# Patient Record
Sex: Male | Born: 2000 | Race: Black or African American | Hispanic: No | Marital: Single
Health system: Southern US, Community
[De-identification: ages and names within clinical notes are randomized; demographics above are authoritative.]

---
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
## 2018-02-21 ENCOUNTER — Emergency Department (HOSPITAL_COMMUNITY)
Admission: EM | Admit: 2018-02-21 | Discharge: 2018-02-21 | Disposition: A | Discharge status: Home / Self Care | Payer: BLUE CROSS/BLUE SHIELD | Attending: Emergency Medicine | Admitting: Emergency Medicine

## 2018-02-21 ENCOUNTER — Encounter (HOSPITAL_COMMUNITY): Payer: Self-pay

## 2018-02-21 ENCOUNTER — Other Ambulatory Visit: Payer: Self-pay

## 2018-02-21 DIAGNOSIS — R748 Abnormal levels of other serum enzymes: Secondary | ICD-10-CM | POA: Diagnosis not present

## 2018-02-21 DIAGNOSIS — R42 Dizziness and giddiness: Secondary | ICD-10-CM | POA: Diagnosis not present

## 2018-02-21 DIAGNOSIS — R252 Cramp and spasm: Secondary | ICD-10-CM

## 2018-02-21 LAB — BASIC METABOLIC PANEL
ANION GAP: 12 (ref 5–15)
BUN: 18 mg/dL (ref 6–20)
CO2: 27 mmol/L (ref 22–32)
Calcium: 10.4 mg/dL — ABNORMAL HIGH (ref 8.9–10.3)
Chloride: 102 mmol/L (ref 101–111)
Creatinine, Ser: 1.66 mg/dL — ABNORMAL HIGH (ref 0.61–1.24)
GFR calc non Af Amer: 58 mL/min — ABNORMAL LOW (ref >60)
GLUCOSE: 103 mg/dL — AB (ref 65–99)
Potassium: 4.7 mmol/L (ref 3.5–5.1)
Sodium: 141 mmol/L (ref 135–145)

## 2018-02-21 LAB — CBC WITH DIFFERENTIAL/PLATELET
Basophils Absolute: 0 10*3/uL (ref 0.0–0.1)
Basophils Relative: 0 %
Eosinophils Absolute: 0 10*3/uL (ref 0.0–0.7)
Eosinophils Relative: 0 %
HEMATOCRIT: 45.9 % (ref 39.0–52.0)
Hemoglobin: 15.6 g/dL (ref 13.0–17.0)
LYMPHS PCT: 14 %
Lymphs Abs: 1.2 10*3/uL (ref 0.7–4.0)
MCH: 30.3 pg (ref 26.0–34.0)
MCHC: 34 g/dL (ref 30.0–36.0)
MCV: 89.1 fL (ref 78.0–100.0)
MONO ABS: 0.6 10*3/uL (ref 0.1–1.0)
MONOS PCT: 7 %
NEUTROS ABS: 6.9 10*3/uL (ref 1.7–7.7)
NEUTROS PCT: 79 %
Platelets: 359 10*3/uL (ref 150–400)
RBC: 5.15 MIL/uL (ref 4.22–5.81)
RDW: 12.3 % (ref 11.5–15.5)
WBC: 8.7 10*3/uL (ref 4.0–10.5)

## 2018-02-21 LAB — CK: Total CK: 1908 U/L — ABNORMAL HIGH (ref 49–397)

## 2018-02-21 LAB — MAGNESIUM: Magnesium: 2.1 mg/dL (ref 1.7–2.4)

## 2018-02-21 MED ORDER — SODIUM CHLORIDE 0.9 % IV BOLUS
2000.0000 mL | Freq: Once | INTRAVENOUS | Status: AC
  Administered 2018-02-21: 2000 mL via INTRAVENOUS

## 2018-02-21 NOTE — ED Notes (Signed)
Bed: [facility] Expected date:  Expected time:  Means of arrival:  Comments: 17 yo leg cramps, dehydration

## 2018-02-21 NOTE — ED Triage Notes (Signed)
Pt BIB [facility] from restaurant. Pt was playing basketball today and reports that he didn't drink much water. He reports that both legs are cramping. States that he has had cramps before, but never this badly.

## 2018-02-21 NOTE — Discharge Instructions (Signed)
You were seen in the ED today with muscle cramping. You CK lab was elevated which is a sign of muscle damage. We do not want this lab to increase because if can put strain on the kidneys. You will need to have your CK and Basic Metabolic Panel re-drawn in 36 hours. Your PCP can do this but if they cannot you should return to the ED for repeat labs.   Drink lots of water and rest. No exercise. If you urine becomes dark yellow, brown, or red you need to return to the ED immediately.

## 2018-02-21 NOTE — ED Provider Notes (Signed)
Emergency Department Provider Note   I have reviewed the triage vital signs and the nursing notes.   HISTORY  Chief Complaint Leg Cramps   HPI Albert Yu is a 17 y.o. male presents to the emergency department by EMS for evaluation of leg cramping and generalized weakness after playing 3 indoor basketball games today.  Patient states he did not drink very much water during the games.  He was able to eat some food between games but admits to doing a poor job with hydration.  He is having cramping pain in both legs.  Mom states his right leg developed a "knot" near the knee which she was able to massage out.  He states that he "just wasn't feeling right" so paramedics were called.  He denies any palpitations or chest pain.  No symptoms during play but began to feel lightheaded afterwards.    History reviewed. No pertinent past medical history.  There are no active problems to display for this patient.    Allergies Patient has no known allergies.  History reviewed. No pertinent family history.  Social History Social History   Tobacco Use  . Smoking status: Not on file  Substance Use Topics  . Alcohol use: Not on file  . Drug use: Not on file    Review of Systems  Constitutional: No fever/chills. Positive lightheadedness.  Eyes: No visual changes. ENT: No sore throat. Cardiovascular: Denies chest pain. Respiratory: Denies shortness of breath. Gastrointestinal: No abdominal pain.  No nausea, no vomiting.  No diarrhea.  No constipation. Genitourinary: Negative for dysuria. Musculoskeletal: Negative for back pain. Positive bilateral thigh cramping.  Skin: Negative for rash. Neurological: Negative for headaches, focal weakness or numbness.  10-point ROS otherwise negative.  ____________________________________________   PHYSICAL EXAM:  VITAL SIGNS: ED Triage Vitals  Enc Vitals Group     BP 02/21/18 1926 (!) 144/82     Pulse Rate 02/21/18 1926 88     Resp  02/21/18 1926 14     Temp 02/21/18 1926 97.9 F (36.6 C)     Temp Source 02/21/18 1926 Oral     SpO2 02/21/18 1926 99 %     Pain Score 02/21/18 1927 10   Constitutional: Slightly drowsy but able to provide a full history. Well appearing and in no acute distress. Eyes: Conjunctivae are normal.  Head: Atraumatic. Nose: No congestion/rhinnorhea. Mouth/Throat: Mucous membranes are slightly dry.  Neck: No stridor.  Cardiovascular: Normal rate, regular rhythm. Good peripheral circulation. Grossly normal heart sounds.   Respiratory: Normal respiratory effort.  No retractions. Lungs CTAB. Gastrointestinal: Soft and nontender. No distention.  Musculoskeletal: No lower extremity tenderness nor edema. No gross deformities of extremities. Neurologic:  Normal speech and language. No gross focal neurologic deficits are appreciated.  Skin:  Skin is warm, dry and intact. No rash noted.  ____________________________________________   LABS (all labs ordered are listed, but only abnormal results are displayed)  Labs Reviewed  BASIC METABOLIC PANEL - Abnormal; Notable for the following components:      Result Value   Glucose, Bld 103 (*)    Creatinine, Ser 1.66 (*)    Calcium 10.4 (*)    GFR calc non Af Amer 58 (*)    All other components within normal limits  CK - Abnormal; Notable for the following components:   Total CK 1,908 (*)    All other components within normal limits  CBC WITH DIFFERENTIAL/PLATELET  MAGNESIUM   ____________________________________________  EKG   EKG Interpretation  Date/Time:  Sunday Feb 21 2018 20:20:16 EDT Ventricular Rate:  82 PR Interval:  222 QRS Duration: 86 QT Interval:  362 QTC Calculation: 422 R Axis:   85 Text Interpretation:  Sinus rhythm with 1st degree A-V block Early repolarization Otherwise normal ECG No STEMI. No old tracing for comparison.  Confirmed by Alona Bene [phone]) on 02/21/2018 8:29:21 PM        ____________________________________________  RADIOLOGY  None ____________________________________________   PROCEDURES  Procedure(s) performed:   Procedures  None ____________________________________________   INITIAL IMPRESSION / ASSESSMENT AND PLAN / ED COURSE  Pertinent labs & imaging results that were available during my care of the patient were reviewed by me and considered in my medical decision making (see chart for details).  Patient presents to the emergency department for evaluation of leg cramping with lightheadedness.  He is somewhat drowsy during my initial evaluation but denies any head trauma during the games.  He appears dehydrated.  Plan for 2 L IV fluid along with labs including chemistry and CK.   CK elevated although not to the level where I would be concerned for rhabdo. Creatinine elevated but BUN normal. Normal electrolytes. Patient is large and very muscular which may be contributing to increased creatinine. Doubt very seriously that CK is causing kidney injury at this level. He reports feeling much better after 2L IVF. He is urinating light yellow urine. I had a Ashni Lonzo discussion regarding results with the patient and mom at bedside. Ultimately, they have close PCP follow up at home and would prefer to get repeat labs at PCP in 36 hours rather than admit. I discussed that if they cannot be seen and labs repeated in 36 hours they should return to the ED for repeat CK and BMP. Discussed that if urine becomes dark or cramping worsens he should return sooner. Will PO hydrate at home with water/electrolyte sports drink.   At this time, I do not feel there is any life-threatening condition present. I have reviewed and discussed all results (EKG, imaging, lab, urine as appropriate), exam findings with patient. I have reviewed nursing notes and appropriate previous records.  I feel the patient is safe to be discharged home without further emergent workup. Discussed  usual and customary return precautions. Patient and family (if present) verbalize understanding and are comfortable with this plan.  Patient will follow-up with their primary care provider. If they do not have a primary care provider, information for follow-up has been provided to them. All questions have been answered.  ____________________________________________  FINAL CLINICAL IMPRESSION(S) / ED DIAGNOSES  Final diagnoses:  Muscle cramping  Elevated CK     MEDICATIONS GIVEN DURING THIS VISIT:  Medications  sodium chloride 0.9 % bolus 2,000 mL (0 mLs Intravenous Stopped 02/21/18 2237)    Note:  This document was prepared using Dragon voice recognition software and may include unintentional dictation errors.  Alona Bene, MD Emergency Medicine    Johna Kearl, Arlyss Repress, MD 02/22/18 [phone]

## 2018-05-26 NOTE — Progress Notes (Signed)
[facility] [address] [place], [place] [address] Phone: [phone] Subjective:      CC: Leg cramping  [id number]  Albert Yu is a 17 y.o. male coming in with complaint of leg cramping. Has history of rhabdomyolysis from May 2019 seen in the emergency room.Marland Kitchen Has not been having any cramping since injury. Does stay hydrated during activity since injury. Is going to be going to prep school and wants to make sure that he is healthy to do so.  Has been watching his diet and staying more hydrated.  Patient though has not worked out significantly arthritis noted since then.  Patient wants to be able to play collegiate basketball as well.     History reviewed. No pertinent past medical history. History reviewed. No pertinent surgical history. Social History   Socioeconomic History  . Marital status: Single    Spouse name: Not on file  . Number of children: Not on file  . Years of education: Not on file  . Highest education level: Not on file  Occupational History  . Not on file  Social Needs  . Financial resource strain: Not on file  . Food insecurity:    Worry: Not on file    Inability: Not on file  . Transportation needs:    Medical: Not on file    Non-medical: Not on file  Tobacco Use  . Smoking status: Not on file  Substance and Sexual Activity  . Alcohol use: Not on file  . Drug use: Not on file  . Sexual activity: Not on file  Lifestyle  . Physical activity:    Days per week: Not on file    Minutes per session: Not on file  . Stress: Not on file  Relationships  . Social connections:    Talks on phone: Not on file    Gets together: Not on file    Attends religious service: Not on file    Active member of club or organization: Not on file    Attends meetings of clubs or organizations: Not on file    Relationship status: Not on file  Other Topics Concern  . Not on file  Social History Narrative  . Not on file   No Known  Allergies History reviewed. No pertinent family history.  No family history of early kidney disease or cardiac history     Current Outpatient Medications (Analgesics):  Marland Kitchen  Ibuprofen-Famotidine (DUEXIS PO), Take 1 tablet by mouth daily.      Past medical history, social, surgical and family history all reviewed in electronic medical record.  No pertanent information unless stated regarding to the chief complaint.   Review of Systems:  No headache, visual changes, nausea, vomiting, diarrhea, constipation, dizziness, abdominal pain, skin rash, fevers, chills, night sweats, weight loss, swollen lymph nodes, body aches, joint swelling, muscle aches, chest pain, shortness of breath, mood changes.   Objective  Blood pressure 128/84, pulse 69, weight 234 lb (106.1 kg), SpO2 98 %.    General: No apparent distress alert and oriented x3 mood and affect normal, dressed appropriately.  HEENT: Pupils equal, extraocular movements intact  Respiratory: Patient's speak in full sentences and does not appear short of breath  Cardiovascular: No lower extremity edema, non tender, no erythema regular rate and rhythm no murmur appreciated Skin: Warm dry intact with no signs of infection or rash on extremities or on axial skeleton.  Abdomen: Soft nontender  Neuro: Cranial nerves II through XII  are intact, neurovascularly intact in all extremities with 2+ DTRs and 2+ pulses.  Lymph: No lymphadenopathy of posterior or anterior cervical chain or axillae bilaterally.  Gait normal with good balance and coordination.  MSK:  Non tender with full range of motion and good stability and symmetric strength and tone of shoulders, elbows, wrist, hip, knee and ankles bilaterally.  Neck: Inspection unremarkable. No palpable stepoffs. Negative Spurling's maneuver. Full neck range of motion Grip strength and sensation normal in bilateral hands Strength good C4 to T1 distribution No sensory change to C4 to T1 Negative  Hoffman sign bilaterally Reflexes normal  Back Exam:  Inspection: Unremarkable  Motion: Flexion 45 deg, Extension 45 deg, Side Bending to 45 deg bilaterally,  Rotation to 45 deg bilaterally  SLR laying: Negative  XSLR laying: Negative  Palpable tenderness: None. FABER: negative. Sensory change: Gross sensation intact to all lumbar and sacral dermatomes.  Reflexes: 2+ at both patellar tendons, 2+ at achilles tendons, Babinski's downgoing.  Strength at foot  Plantar-flexion: 5/5 Dorsi-flexion: 5/5 Eversion: 5/5 Inversion: 5/5  Leg strength  Quad: 5/5 Hamstring: 5/5 Hip flexor: 5/5 Hip abductors: 5/5  Gait unremarkable.    Impression and Recommendations:     This case required medical decision making of moderate complexity. The above documentation has been reviewed and is accurate and complete Judi SaaZachary M Syncere Kaminski, DO       Note: This dictation was prepared with Dragon dictation along with smaller phrase technology. Any transcriptional errors that result from this process are unintentional.

## 2018-05-27 ENCOUNTER — Encounter: Payer: Self-pay | Admitting: Family Medicine

## 2018-05-27 ENCOUNTER — Ambulatory Visit (INDEPENDENT_AMBULATORY_CARE_PROVIDER_SITE_OTHER): Payer: 59 | Admitting: Family Medicine

## 2018-05-27 DIAGNOSIS — E860 Dehydration: Secondary | ICD-10-CM | POA: Diagnosis not present

## 2018-05-27 NOTE — Patient Instructions (Signed)
Good to see you  Stay active Stay hydrated  Every 1 cup of water 1 cup of gatorade A mult vitamin, Vitamin D 2000 U daily and CoQ10 200mg  daily  Carbs after working out for sure! At least 20 grams of protein after working out and need to eat within 30 minutes of workouts.  Eat something little every 2 hours Red meat 2 times a week  Vega sport or plant base supplements otherwise but avoid soy  You will do great  Any questions write us or call [phone]

## 2018-05-27 NOTE — Assessment & Plan Note (Signed)
I believe the patient did have more of a dehydration after exertion. With patient's CK level being elevated but not greater than 50,000 not likely more of a rhabdomyolysis.  Patient creatinine was elevated but patient is fairly muscular.  Discussed with him that I feel like he will be able to participate in any type of support at this time but we did discuss different diet and hydration changes that would be more beneficial.  We discussed that CoQ10 can help stabilize muscle fibers and could also be of benefit if patient feels like he has cramping.  Patient does not give any other history of cramping.  On exam today I see no other reason for the lightheadedness the patient was having other than the dehydration with patient having a regular cardiovascular exam.  Patient can follow-up with me with any questions and will return in 4 to 6 weeks to see how patient does with the changes.

## 2019-04-07 ENCOUNTER — Encounter: Payer: Self-pay | Admitting: Family Medicine

## 2019-04-07 ENCOUNTER — Other Ambulatory Visit: Payer: Self-pay

## 2019-04-07 ENCOUNTER — Ambulatory Visit (INDEPENDENT_AMBULATORY_CARE_PROVIDER_SITE_OTHER): Payer: 59 | Admitting: Family Medicine

## 2019-04-07 DIAGNOSIS — Z025 Encounter for examination for participation in sport: Secondary | ICD-10-CM

## 2019-04-07 DIAGNOSIS — Z0000 Encounter for general adult medical examination without abnormal findings: Secondary | ICD-10-CM

## 2019-04-07 NOTE — Assessment & Plan Note (Signed)
Cleared for sports

## 2019-04-07 NOTE — Progress Notes (Signed)
[facility] [address] [place], [place] [address] Phone: [phone] Subjective:   Albert Albert [name], am serving as a scribe for Dr. Hulan Saas.  I'm seeing this patient by the request  of:    CC:   [id number]   05/27/2018: I believe the patient did have more of a dehydration after exertion. With patient's CK level being elevated but not greater than 50,000 not likely more of a rhabdomyolysis.  Patient creatinine was elevated but patient is fairly muscular.  Discussed with him that I feel like he will be able to participate in any type of support at this time but we did discuss different diet and hydration changes that would be more beneficial.  We discussed that CoQ10 can help stabilize muscle fibers and could also be of benefit if patient feels like he has cramping.  Patient does not give any other history of cramping.  On exam today I see Albert [name] other reason for the lightheadedness the patient was having other than the dehydration with patient having a regular cardiovascular exam.  Patient can follow-up with me with any questions and will return in 4 to 6 weeks to see how patient does with the changes.  Update 04/07/2019 Albert Albert [name] is a 18 y.o. male coming in with complaint of knee pain and is here for a sports physical. Has not been having any issues with dehydration and cramping since last visit.       Albert [name] past medical history on file. Albert [name] past surgical history on file. Social History   Socioeconomic History  . Marital status: Single    Spouse name: Not on file  . Number of children: Not on file  . Years of education: Not on file  . Highest education level: Not on file  Occupational History  . Not on file  Social Needs  . Financial resource strain: Not on file  . Food insecurity    Worry: Not on file    Inability: Not on file  . Transportation needs    Medical: Not on file    Non-medical: Not on file  Tobacco Use  . Smoking status: Not on  file  Substance and Sexual Activity  . Alcohol use: Not on file  . Drug use: Not on file  . Sexual activity: Not on file  Lifestyle  . Physical activity    Days per week: Not on file    Minutes per session: Not on file  . Stress: Not on file  Relationships  . Social Herbalist on phone: Not on file    Gets together: Not on file    Attends religious service: Not on file    Active member of club or organization: Not on file    Attends meetings of clubs or organizations: Not on file    Relationship status: Not on file  Other Topics Concern  . Not on file  Social History Narrative  . Not on file   Albert [name] Known Allergies Albert [name] family history on file.     Current Outpatient Medications (Analgesics):  Marland Kitchen  Ibuprofen-Famotidine (DUEXIS PO), Take 1 tablet by mouth daily.      Past medical history, social, surgical and family history all reviewed in electronic medical record.  Albert [name] pertanent information unless stated regarding to the chief complaint.   Review of Systems:  Albert [name] headache, visual changes, nausea, vomiting, diarrhea, constipation, dizziness, abdominal pain, skin rash, fevers, chills, night sweats, weight loss, swollen lymph  nodes, body aches, joint swelling, muscle aches, chest pain, shortness of breath, mood changes.   Objective  Blood pressure 102/80, pulse 74, height 6\' 2"  (1.88 m), weight 264 lb 9.6 oz (120 kg), SpO2 96 %.    General: Albert [name] apparent distress alert and oriented x3 mood and affect normal, dressed appropriately.  HEENT: Pupils equal, extraocular movements intact  Respiratory: Patient's speak in full sentences and does not appear short of breath  Cardiovascular: Albert [name] lower extremity edema, non tender, Albert [name] erythema RRR Albert [name] murmur   Skin: Warm dry intact with Albert [name] signs of infection or rash on extremities or on axial skeleton.  Abdomen: Soft nontender  Neuro: Cranial nerves II through XII are intact, neurovascularly intact in all extremities with 2+ DTRs and  2+ pulses.  Lymph: Albert [name] lymphadenopathy of posterior or anterior cervical chain or axillae bilaterally.  Gait normal with good balance and coordination.  MSK:  Non tender with full range of motion and good stability and symmetric strength and tone of shoulders, elbows, wrist, hip, knee and ankles bilaterally.     Impression and Recommendations:     This case required medical decision making of moderate complexity. The above documentation has been reviewed and is accurate and complete Albert SaaZachary M Janell Keeling, DO       Note: This dictation was prepared with Dragon dictation along with smaller phrase technology. Any transcriptional errors that result from this process are unintentional.

## 2019-11-28 ENCOUNTER — Telehealth: Payer: Self-pay | Admitting: Family Medicine

## 2019-11-28 NOTE — Telephone Encounter (Signed)
We can see them first if they would like and see what we can do

## 2019-11-28 NOTE — Telephone Encounter (Signed)
Spoke with Alecia Lemming, patient's dad. Patient had ACL reconstruction by Dr. Thurston Hole in 2018. Patient is now having antalgic gait and his dad thinks patient needs to have scar tissue removed. Wanted your opinion on if they should come to see you or schedule a visit directly with Dr. Thurston Hole. Please advise.

## 2019-11-28 NOTE — Telephone Encounter (Signed)
Patient's dad called stating that he was seen by Dr Katrinka Blazing last year for an ACL injury. He had surgery but they are now wanting to get the scar tissue cleaned up and did not know where to go from here. Would he need to see Dr Katrinka Blazing first or what would you recommend?  Dad Alecia Lemming): [phone]

## 2019-11-29 NOTE — Telephone Encounter (Signed)
Patient scheduled.

## 2019-12-01 ENCOUNTER — Ambulatory Visit: Payer: 59 | Admitting: Family Medicine

## 2019-12-01 ENCOUNTER — Telehealth: Payer: Self-pay | Admitting: Family Medicine

## 2019-12-01 NOTE — Telephone Encounter (Signed)
Patient in quarantine due to exposure to COVID. Rescheduled to the end of March.

## 2019-12-01 NOTE — Telephone Encounter (Signed)
Left message for patient's father to call back.

## 2019-12-01 NOTE — Telephone Encounter (Signed)
Patient's father called asking for a call back when available.   Father Alecia Lemming): [phone]

## 2019-12-02 ENCOUNTER — Ambulatory Visit: Payer: 59 | Admitting: Family Medicine

## 2019-12-27 ENCOUNTER — Ambulatory Visit (INDEPENDENT_AMBULATORY_CARE_PROVIDER_SITE_OTHER): Payer: 59

## 2019-12-27 ENCOUNTER — Ambulatory Visit (INDEPENDENT_AMBULATORY_CARE_PROVIDER_SITE_OTHER): Payer: 59 | Admitting: Family Medicine

## 2019-12-27 ENCOUNTER — Other Ambulatory Visit: Payer: Self-pay

## 2019-12-27 ENCOUNTER — Encounter: Payer: Self-pay | Admitting: Family Medicine

## 2019-12-27 VITALS — BP 128/88 | HR 72 | Ht 74.0 in | Wt 275.0 lb

## 2019-12-27 DIAGNOSIS — M25562 Pain in left knee: Secondary | ICD-10-CM

## 2019-12-27 DIAGNOSIS — G8929 Other chronic pain: Secondary | ICD-10-CM

## 2019-12-27 DIAGNOSIS — M25662 Stiffness of left knee, not elsewhere classified: Secondary | ICD-10-CM | POA: Insufficient documentation

## 2019-12-27 NOTE — Progress Notes (Signed)
[place] Phone: [phone] Subjective:   Fontaine No, am serving as a scribe for Dr. Hulan Saas. This visit occurred during the SARS-CoV-2 public health emergency.  Safety protocols were in place, including screening questions prior to the visit, additional usage of staff PPE, and extensive cleaning of exam room while observing appropriate contact time as indicated for disinfecting solutions.    I'm seeing this patient by the request  of:  Rosalyn Charters, MD  CC: Knee pain  [id number]  Albert Yu is a 19 y.o. male coming in with complaint of left knee pain since 2018 after ACL surgery. [employer]. Is unable to fully extend or flex his knee. Jumping or cutting increases pain over lateral hamstring. Patient notices a tightness on a consistent basis. Father says patient walks with antalgic gait.       No past medical history on file. No past surgical history on file. Social History   Socioeconomic History  . Marital status: Single    Spouse name: Not on file  . Number of children: Not on file  . Years of education: Not on file  . Highest education level: Not on file  Occupational History  . Not on file  Tobacco Use  . Smoking status: Not on file  Substance and Sexual Activity  . Alcohol use: Not on file  . Drug use: Not on file  . Sexual activity: Not on file  Other Topics Concern  . Not on file  Social History Narrative  . Not on file   Social Determinants of Health   Financial Resource Strain:   . Difficulty of Paying Living Expenses:   Food Insecurity:   . Worried About Charity fundraiser in the Last Year:   . Arboriculturist in the Last Year:   Transportation Needs:   . Film/video editor (Medical):   Marland Kitchen Lack of Transportation (Non-Medical):   Physical Activity:   . Days of Exercise per Week:   . Minutes of Exercise per Session:   Stress:   . Feeling of Stress  :   Social Connections:   . Frequency of Communication with Friends and Family:   . Frequency of Social Gatherings with Friends and Family:   . Attends Religious Services:   . Active Member of Clubs or Organizations:   . Attends Archivist Meetings:   Marland Kitchen Marital Status:    No Known Allergies No family history on file.     Current Outpatient Medications (Analgesics):  Marland Kitchen  Ibuprofen-Famotidine (DUEXIS PO), Take 1 tablet by mouth daily.     Reviewed prior external information including notes and imaging from  primary care provider As well as notes that were available from care everywhere and other healthcare systems.  Past medical history, social, surgical and family history all reviewed in electronic medical record.  No pertanent information unless stated regarding to the chief complaint.   Review of Systems:  No headache, visual changes, nausea, vomiting, diarrhea, constipation, dizziness, abdominal pain, skin rash, fevers, chills, night sweats, weight loss, swollen lymph nodes, body aches, joint swelling, chest pain, shortness of breath, mood changes. POSITIVE muscle aches  Objective  Blood pressure 128/88, pulse 72, height 6\' 2"  (1.88 m), weight 275 lb (124.7 kg), SpO2 98 %.   General: No apparent distress alert and oriented x3 mood and affect normal, dressed appropriately.  HEENT: Pupils equal, extraocular movements intact  Respiratory: Patient's  speak in full sentences and does not appear short of breath  Cardiovascular: No lower extremity edema, non tender, no erythema  Neuro: Cranial nerves II through XII are intact, neurovascularly intact in all extremities with 2+ DTRs and 2+ pulses.  Gait antalgic gait Left knee exam patient is unable to actually straighten the knee with walking.  Patient also lacks the last 10 degrees of extension in the last 10 degrees of flexion compared to the contralateral side.  ACL does appear to be intact.  Kneecap seems to do relatively  well at the moment.  Limited musculoskeletal ultrasound was performed and interpreted by Judi Saa  Limited ultrasound of patient's left knee does not show any significant scar tissue on the outside.  Questionable mild scar tissue in the patellofemoral joint.  Patient's meniscus appear to be unremarkable in the medial and lateral joint space.  No joint effusion noted.   Impression and Recommendations:     This case required medical decision making of moderate complexity. The above documentation has been reviewed and is accurate and complete Judi Saa, DO       Note: This dictation was prepared with Dragon dictation along with smaller phrase technology. Any transcriptional errors that result from this process are unintentional.

## 2019-12-27 NOTE — Patient Instructions (Addendum)
Xray today MRI left knee- [facility] [phone] Once you have appointment let us know so we can see you next day

## 2019-12-27 NOTE — Assessment & Plan Note (Signed)
Significant stiffness of the left knee noted today.  Patient has been able to ambulate and play basketball but he has aspirations to play at a higher level.  This is status post ACL repair.  No significant instability noted.  Would like to make sure patient does not have an OCD and likely this is more scar tissue formation with a questionable injections versus formal physical therapy that could be beneficial.  Patient is playing on scholarship and this is a social determinants of health and allows him to go to college.  Patient will have the advanced imaging and then follow-up with me and we will discuss further evaluation and treatment

## 2020-01-26 ENCOUNTER — Ambulatory Visit
Admission: RE | Admit: 2020-01-26 | Discharge: 2020-01-26 | Disposition: A | Discharge status: Home / Self Care | Payer: 59 | Source: Ambulatory Visit | Attending: Family Medicine | Admitting: Family Medicine

## 2020-01-26 ENCOUNTER — Other Ambulatory Visit: Payer: Self-pay

## 2020-01-26 DIAGNOSIS — G8929 Other chronic pain: Secondary | ICD-10-CM

## 2020-01-26 DIAGNOSIS — M25562 Pain in left knee: Secondary | ICD-10-CM

## 2020-01-27 ENCOUNTER — Encounter: Payer: Self-pay | Admitting: Family Medicine

## 2020-01-27 ENCOUNTER — Ambulatory Visit (INDEPENDENT_AMBULATORY_CARE_PROVIDER_SITE_OTHER): Payer: 59 | Admitting: Family Medicine

## 2020-01-27 VITALS — BP 122/92 | HR 66 | Ht 74.0 in | Wt 270.0 lb

## 2020-01-27 DIAGNOSIS — M25662 Stiffness of left knee, not elsewhere classified: Secondary | ICD-10-CM | POA: Diagnosis not present

## 2020-01-27 NOTE — Patient Instructions (Signed)
[facility] will call you to schedule Dr. Yisroel Ramming

## 2020-01-27 NOTE — Progress Notes (Signed)
[place] Phone: [phone] Subjective:   Albert [name], am serving as a scribe for Dr. Hulan Saas. This visit occurred during the SARS-CoV-2 public health emergency.  Safety protocols were in place, including screening questions prior to the visit, additional usage of staff PPE, and extensive cleaning of exam room while observing appropriate contact time as indicated for disinfecting solutions.   I'm seeing this patient by the request  of:  Rosalyn Charters, MD  CC: Knee pain follow-up  [id number]   12/27/2019 Significant stiffness of the left knee noted today.  Patient has been able to ambulate and play basketball but he has aspirations to play at a higher level.  This is status post ACL repair.  [name] significant instability noted.  Would like to make sure patient does not have an OCD and likely this is more scar tissue formation with a questionable injections versus formal physical therapy that could be beneficial.  Patient is playing on scholarship and this is a social determinants of health and allows him to go to college.  Patient will have the advanced imaging and then follow-up with me and we will discuss further evaluation and treatment  Update 01/27/2020 Albert [name] is a 19 y.o. male coming in with complaint of left knee pain. Patient states  Continues to have difficulty and is still unable to straighten the leg.  Patient was sent for an MRI.  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     [name] past medical history on file. [name] past surgical history on file. Social History   Socioeconomic History  . Marital status: Single    Spouse name: Not on file  . Number of children: Not on file  . Years of education: Not on file  . Highest education level: Not on file  Occupational  History  . Not on file  Tobacco Use  . Smoking status: Not on file  Substance and Sexual Activity  . Alcohol use: Not on file  . Drug use: Not on file  . Sexual activity: Not on file  Other Topics Concern  . Not on file  Social History Narrative  . Not on file   Social Determinants of Health   Financial Resource Strain:   . Difficulty of Paying Living Expenses:   Food Insecurity:   . Worried About Charity fundraiser in the Last Year:   . Arboriculturist in the Last Year:   Transportation Needs:   . Film/video editor (Medical):   Marland Kitchen Lack of Transportation (Non-Medical):   Physical Activity:   . Days of Exercise per Week:   . Minutes of Exercise per Session:   Stress:   . Feeling of Stress :   Social Connections:   . Frequency of Communication with Friends and Family:   . Frequency of Social Gatherings with Friends and Family:   . Attends Religious Services:   . Active Member of Clubs or Organizations:   . Attends Archivist Meetings:   Marland Kitchen Marital Status:    [name] Known Allergies [name] family history on file.     Current Outpatient Medications (Analgesics):  Marland Kitchen  Ibuprofen-Famotidine (DUEXIS PO), Take 1 tablet by mouth daily.     Reviewed prior external information including notes and imaging from  primary care provider As well as notes that  were available from care everywhere and other healthcare systems.  Past medical history, social, surgical and family history all reviewed in electronic medical record.  [name] pertanent information unless stated regarding to the chief complaint.   Review of Systems:  [name] headache, visual changes, nausea, vomiting, diarrhea, constipation, dizziness, abdominal pain, skin rash, fevers, chills, night sweats, weight loss, swollen lymph nodes, body aches, joint swelling, chest pain, shortness of breath, mood changes. POSITIVE muscle aches  Objective  Blood pressure (!) 122/92, pulse 66, height 6\' 2"  (1.88 m), weight 270 lb  (122.5 kg), SpO2 97 %.   General: [name] apparent distress alert and oriented x3 mood and affect normal, dressed appropriately.  HEENT: Pupils equal, extraocular movements intact  Respiratory: Patient's speak in full sentences and does not appear short of breath  Cardiovascular: [name] lower extremity edema, non tender, [name] erythema  Neuro: Cranial nerves II through XII are intact, neurovascularly intact in all extremities with 2+ DTRs and 2+ pulses.  Gait normal with good balance and coordination.  MSK: Left knee exam continues to and lacks the last 10 degrees of extension of the knee.  Patient with standing Has lack of 15 degrees with standing.  Patient does have near full flexion of the knee.  Patient does have good stability of the ACL noted.    Impression and Recommendations:     This case required medical decision making of moderate complexity. The above documentation has been reviewed and is accurate and complete , DO       Note: This dictation was prepared with Dragon dictation along with smaller phrase technology. Any transcriptional errors that result from this process are unintentional.

## 2020-01-27 NOTE — Assessment & Plan Note (Signed)
Discussed with patient as well as his father and mother virtually and showed him pictures of the MRI.  Total time with patient and as well as with father was greater than 40 minutes.  We discussed different treatment options but with patient playing at a high level of basketball I do feel that surgical intervention would be the most beneficial for him.  Patient will be referred today for further evaluation.  I have already discussed images with fellowship trained radiologist as well as the case with orthopedic surgeon.  Patient knows if any questions to call us.

## 2020-05-09 ENCOUNTER — Encounter: Payer: Self-pay | Admitting: Family Medicine

## 2020-05-09 ENCOUNTER — Other Ambulatory Visit: Payer: Self-pay

## 2020-05-09 ENCOUNTER — Ambulatory Visit (INDEPENDENT_AMBULATORY_CARE_PROVIDER_SITE_OTHER): Payer: 59 | Admitting: Family Medicine

## 2020-05-09 DIAGNOSIS — M25662 Stiffness of left knee, not elsewhere classified: Secondary | ICD-10-CM | POA: Diagnosis not present

## 2020-05-09 DIAGNOSIS — Z025 Encounter for examination for participation in sport: Secondary | ICD-10-CM

## 2020-05-09 NOTE — Progress Notes (Signed)
Albert Yu, am serving as a scribe for Dr. Antoine Primas. This visit occurred during the SARS-CoV-2 public health emergency.  Safety protocols were in place, including screening questions prior to the visit, additional usage of staff PPE, and extensive cleaning of exam room while observing appropriate contact time as indicated for disinfecting solutions.   Patient is a 19 y.o. year old male here for sports physical.  Patient plans to play   Reports no current complaints.  Denies chest pain, shortness of breath, passing out with exercise.  No medical problems.  No family history of heart disease or sudden death before age 19.   Vision left 20/13, right 20/15. Blood pressure normal for age and height   No past medical history on file.  Current Outpatient Medications on File Prior to Visit  Medication Sig Dispense Refill  . Ibuprofen-Famotidine (DUEXIS PO) Take 1 tablet by mouth daily.     No current facility-administered medications on file prior to visit.    No past surgical history on file.  No Known Allergies  Social History   Socioeconomic History  . Marital status: Single    Spouse name: Not on file  . Number of children: Not on file  . Years of education: Not on file  . Highest education level: Not on file  Occupational History  . Not on file  Tobacco Use  . Smoking status: Not on file  Substance and Sexual Activity  . Alcohol use: Not on file  . Drug use: Not on file  . Sexual activity: Not on file  Other Topics Concern  . Not on file  Social History Narrative  . Not on file   Social Determinants of Health   Financial Resource Strain:   . Difficulty of Paying Living Expenses:   Food Insecurity:   . Worried About Programme researcher, broadcasting/film/video in the Last Year:   . Barista in the Last Year:   Transportation Needs:   . Freight forwarder (Medical):   Marland Kitchen Lack of Transportation (Non-Medical):   Physical Activity:   . Days of Exercise per Week:   . Minutes  of Exercise per Session:   Stress:   . Feeling of Stress :   Social Connections:   . Frequency of Communication with Friends and Family:   . Frequency of Social Gatherings with Friends and Family:   . Attends Religious Services:   . Active Member of Clubs or Organizations:   . Attends Banker Meetings:   Marland Kitchen Marital Status:   Intimate Partner Violence:   . Fear of Current or Ex-Partner:   . Emotionally Abused:   Marland Kitchen Physically Abused:   . Sexually Abused:     No family history on file.  There were no vitals taken for this visit.  Review of Systems: See HPI above.  Physical Exam: Gen: NAD CV: RRR no MRG  Lungs: CTAB MSK: FROM and strength all joints and muscle groups.  No evidence scoliosis. Patient's left knee has improvement in range of motion.  Lacks the last 2 degrees of extension in the last 10 degrees of flexion but significant improvement from previous exam.   Assessment/Plan: Knee stiffness, left Patient's knee is significantly improved at this time.  Patient did have with surgical intervention to prevent fibrosis.  Patient has had improvement in range of motion immediately.  Is clear for the sports physical in my opinion with this.  Will take some time for patient to get strength  but I am happy with the result so far   1. Sports physical: Cleared for all sports without restrictions.  Patient now is going to be playing in college and likely will be cleared by a physician as well

## 2020-05-09 NOTE — Assessment & Plan Note (Signed)
Cleared for sports

## 2020-05-09 NOTE — Assessment & Plan Note (Signed)
Patient's knee is significantly improved at this time.  Patient did have with surgical intervention to prevent fibrosis.  Patient has had improvement in range of motion immediately.  Is clear for the sports physical in my opinion with this.  Will take some time for patient to get strength but I am happy with the result so far

## 2020-05-09 NOTE — Patient Instructions (Signed)
Patient

## 2021-02-13 ENCOUNTER — Telehealth: Payer: Self-pay | Admitting: Family Medicine

## 2021-02-13 NOTE — Telephone Encounter (Signed)
Patient's mother called stating that the patient is needing a form filled out to be able to play basketball in college.  He is unable to come in next week and will need it by the first part of June. Unsure of where to schedule him for this to be completed.

## 2021-02-13 NOTE — Telephone Encounter (Signed)
Appointment scheduled.

## 2021-03-05 NOTE — Progress Notes (Signed)
  Entered in error.  Patient was here for sports physical but the paperwork is for his new school.  Patient will see the sports medicine physician there and he can call if he has any questions

## 2021-03-06 ENCOUNTER — Encounter: Payer: Self-pay | Admitting: Family Medicine

## 2021-03-06 ENCOUNTER — Ambulatory Visit: Payer: 59 | Admitting: Family Medicine

## 2021-03-06 ENCOUNTER — Other Ambulatory Visit: Payer: Self-pay

## 2021-08-03 IMAGING — DX DG KNEE 3 VIEWS*L*
3 series · 3 of 3 positions shown · non-contrast
Comparison: 02/09/2017

CLINICAL DATA: Both knee pain, history of ACL repair, limited range
of motion

EXAM:
LEFT KNEE - 3 VIEW

[knee ap]
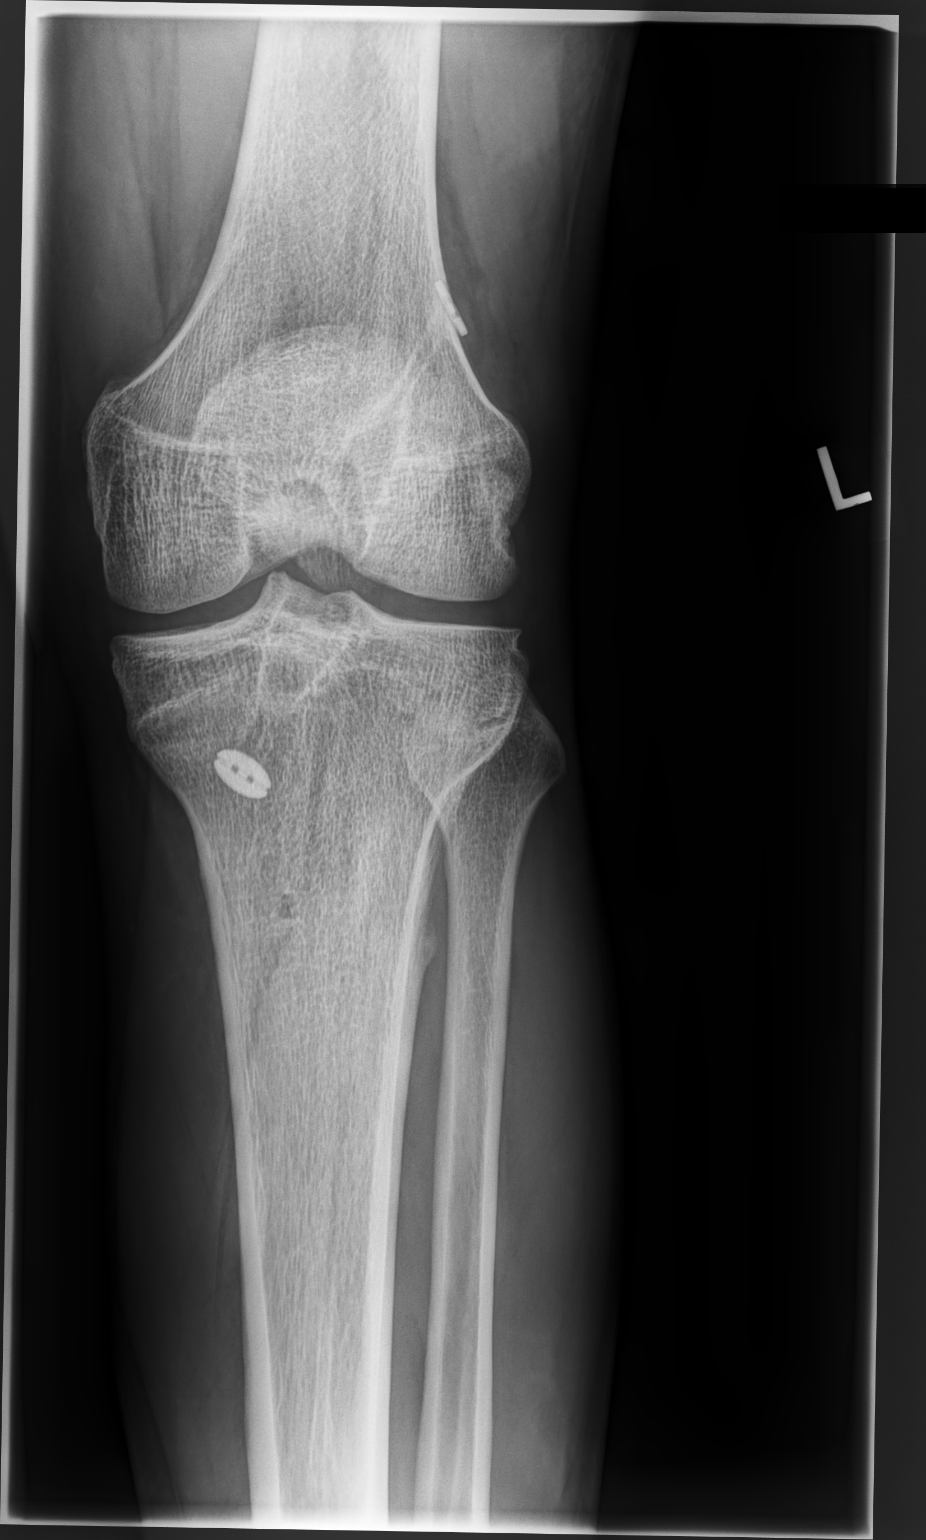

[knee lat]
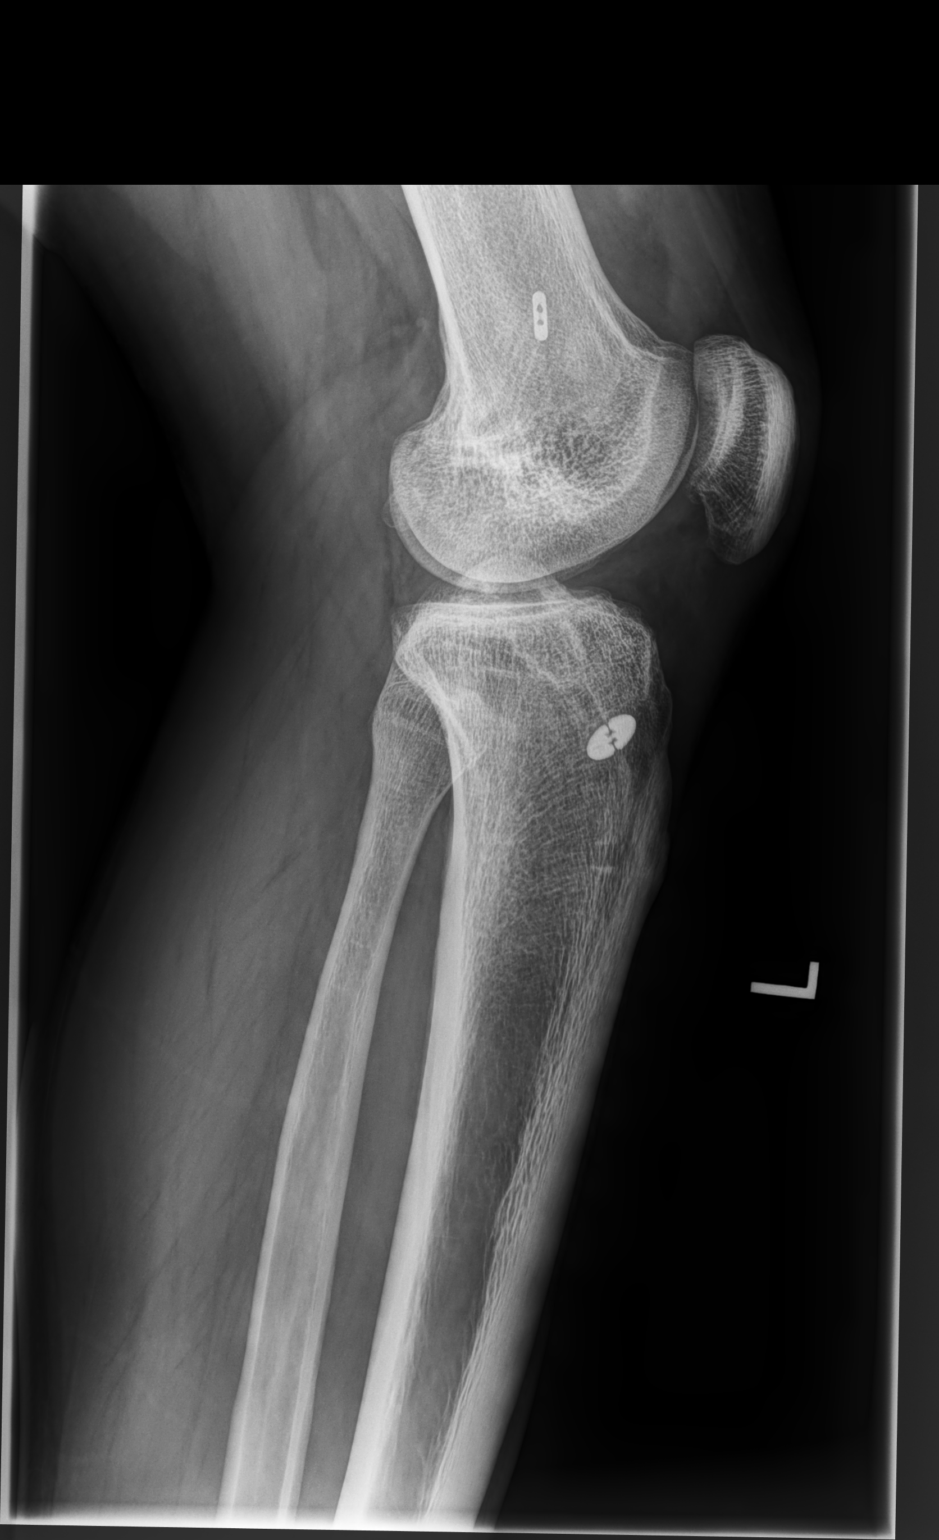

[patella (sunrise) tan]
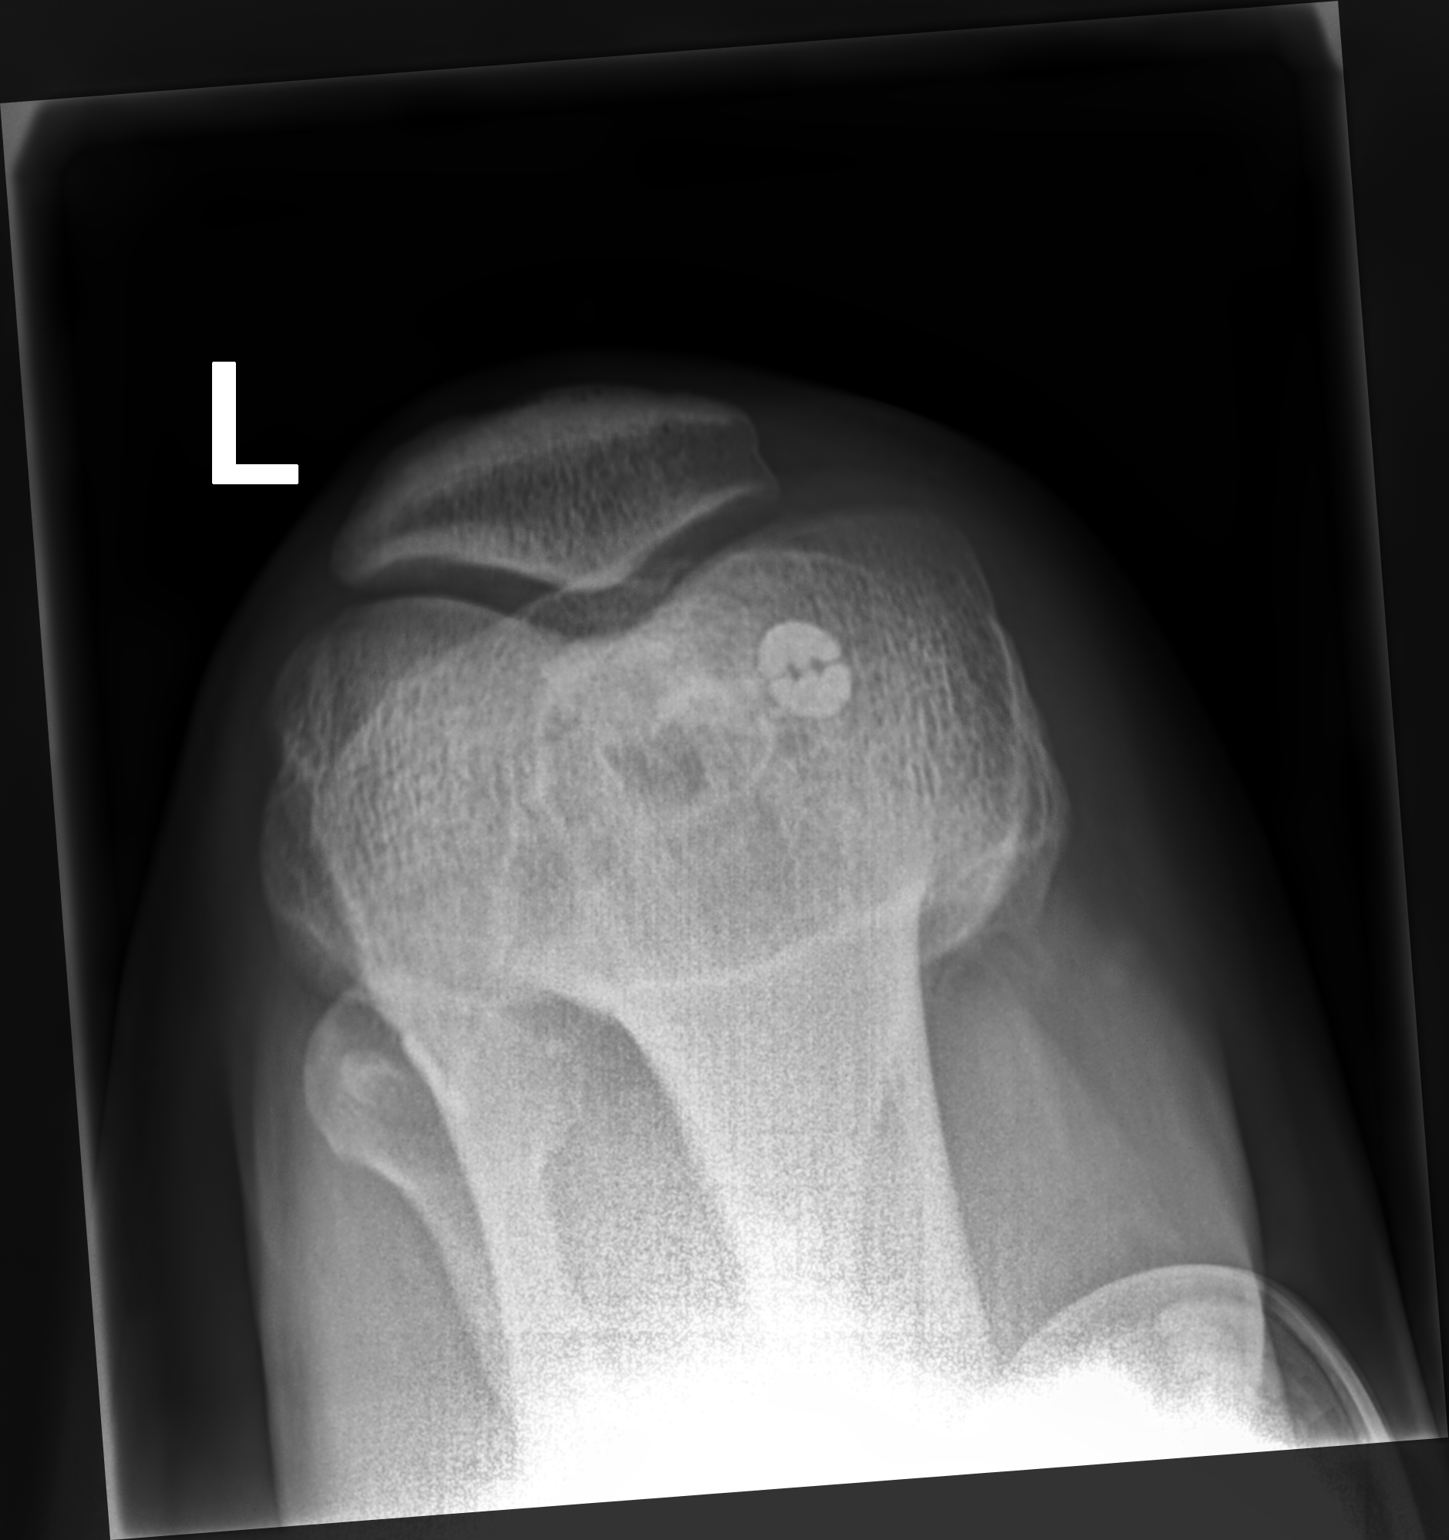

[3 of 3 positions shown; findings below may reference images not displayed]

FINDINGS: Frontal, lateral, and sunrise views of the left knee are obtained.
Postsurgical changes are seen from prior ACL repair. No acute
displaced fracture, subluxation, or dislocation. Joint spaces are
well preserved. There is a small joint effusion.
IMPRESSION: 1. Postsurgical changes from ACL repair.
2. Small joint effusion.
3. No acute bony abnormality.

## 2021-11-14 ENCOUNTER — Ambulatory Visit: Payer: 59 | Admitting: Family Medicine
